# Patient Record
Sex: Male | Born: 1990 | Race: Black or African American | Hispanic: No | Marital: Single | Smoking: Current every day smoker
Health system: Southern US, Community
[De-identification: ages and names within clinical notes are randomized; demographics above are authoritative.]

---

## 2009-08-09 ENCOUNTER — Emergency Department: Payer: Self-pay | Admitting: Unknown Physician Specialty

## 2011-09-12 ENCOUNTER — Emergency Department: Payer: Self-pay | Admitting: Emergency Medicine

## 2011-09-19 ENCOUNTER — Emergency Department: Payer: Self-pay | Admitting: *Deleted

## 2012-06-08 ENCOUNTER — Emergency Department: Payer: Self-pay | Admitting: Emergency Medicine

## 2012-06-08 LAB — URINALYSIS, COMPLETE
Bilirubin,UR: NEGATIVE
Blood: NEGATIVE
Ketone: NEGATIVE
Nitrite: NEGATIVE
Ph: 7 (ref 4.5–8.0)
Protein: NEGATIVE
RBC,UR: 1 /HPF (ref 0–5)
Specific Gravity: 1.009 (ref 1.003–1.030)

## 2012-06-08 LAB — COMPREHENSIVE METABOLIC PANEL
Alkaline Phosphatase: 70 U/L (ref 50–136)
BUN: 12 mg/dL (ref 7–18)
Calcium, Total: 9.3 mg/dL (ref 8.5–10.1)
Creatinine: 0.94 mg/dL (ref 0.60–1.30)
EGFR (African American): >60
EGFR (Non-African Amer.): >60
Glucose: 80 mg/dL (ref 65–99)
SGOT(AST): 41 U/L — ABNORMAL HIGH (ref 15–37)
SGPT (ALT): 27 U/L (ref 12–78)
Sodium: 140 mmol/L (ref 136–145)

## 2012-06-08 LAB — LIPASE, BLOOD: Lipase: 57 U/L — ABNORMAL LOW (ref 73–393)

## 2012-06-08 LAB — CBC: HCT: 46.1 % (ref 40.0–52.0)

## 2014-02-27 ENCOUNTER — Emergency Department: Payer: Self-pay | Admitting: Emergency Medicine

## 2014-08-14 ENCOUNTER — Emergency Department
Admission: EM | Admit: 2014-08-14 | Discharge: 2014-08-14 | Disposition: A | Discharge status: Home / Self Care | Payer: Self-pay | Attending: Emergency Medicine | Admitting: Emergency Medicine

## 2014-08-14 ENCOUNTER — Encounter: Payer: Self-pay | Admitting: Emergency Medicine

## 2014-08-14 ENCOUNTER — Emergency Department: Payer: Self-pay

## 2014-08-14 DIAGNOSIS — Y9389 Activity, other specified: Secondary | ICD-10-CM | POA: Insufficient documentation

## 2014-08-14 DIAGNOSIS — Y998 Other external cause status: Secondary | ICD-10-CM | POA: Insufficient documentation

## 2014-08-14 DIAGNOSIS — S0181XA Laceration without foreign body of other part of head, initial encounter: Secondary | ICD-10-CM | POA: Insufficient documentation

## 2014-08-14 DIAGNOSIS — S0083XA Contusion of other part of head, initial encounter: Secondary | ICD-10-CM | POA: Insufficient documentation

## 2014-08-14 DIAGNOSIS — S0181XD Laceration without foreign body of other part of head, subsequent encounter: Secondary | ICD-10-CM

## 2014-08-14 DIAGNOSIS — Y9289 Other specified places as the place of occurrence of the external cause: Secondary | ICD-10-CM | POA: Insufficient documentation

## 2014-08-14 DIAGNOSIS — X58XXXA Exposure to other specified factors, initial encounter: Secondary | ICD-10-CM | POA: Insufficient documentation

## 2014-08-14 DIAGNOSIS — Y929 Unspecified place or not applicable: Secondary | ICD-10-CM | POA: Insufficient documentation

## 2014-08-14 MED ORDER — OXYCODONE-ACETAMINOPHEN 5-325 MG PO TABS
1.0000 | ORAL_TABLET | Freq: Once | ORAL | Status: AC
  Administered 2014-08-14: 1 via ORAL

## 2014-08-14 MED ORDER — SILVER NITRATE-POT NITRATE 75-25 % EX MISC
CUTANEOUS | Status: AC
  Administered 2014-08-14: 15:00:00
  Filled 2014-08-14: qty 2

## 2014-08-14 MED ORDER — OXYCODONE-ACETAMINOPHEN 5-325 MG PO TABS
ORAL_TABLET | ORAL | Status: AC
  Administered 2014-08-14: 1 via ORAL
  Filled 2014-08-14: qty 1

## 2014-08-14 MED ORDER — TRAMADOL HCL 50 MG PO TABS: 50.0000 mg | ORAL_TABLET | Freq: Four times a day (QID) | ORAL | Status: AC | PRN

## 2014-08-14 NOTE — ED Notes (Signed)
Pt discharged home after verbalizing understanding of discharge instructions; nad noted. 

## 2014-08-14 NOTE — ED Notes (Signed)
Patient presents to the ED with bleeding and swelling around wound where stitches were placed a couple of hours ago.  Patient is in no obvious distress.  Bleeding is slight and controlled from the top of laceration that is on the patient's forehead.

## 2014-08-14 NOTE — Discharge Instructions (Signed)
Facial Laceration A facial laceration is a cut on the face. These injuries can be painful and cause bleeding. Some cuts may need to be closed with stitches (sutures), skin adhesive strips, or wound glue. Cuts usually heal quickly but can leave a scar. It can take 1-2 years for the scar to go away completely. HOME CARE   Only take medicines as told by your doctor.  Follow your doctor's instructions for wound care. For Stitches:  Keep the cut clean and dry.  If you have a bandage (dressing), change it at least once a day. Change the bandage if it gets wet or dirty, or as told by your doctor.  Wash the cut with soap and water 2 times a day. Rinse the cut with water. Pat it dry with a clean towel.  Put a thin layer of medicated cream on the cut as told by your doctor.  You may shower after the first 24 hours. Do not soak the cut in water until the stitches are removed.  Have your stitches removed as told by your doctor.  Do not wear any makeup until a few days after your stitches are removed. For Skin Adhesive Strips:  Keep the cut clean and dry.  Do not get the strips wet. You may take a bath, but be careful to keep the cut dry.  If the cut gets wet, pat it dry with a clean towel.  The strips will fall off on their own. Do not remove the strips that are still stuck to the cut. For Wound Glue:  You may shower or take baths. Do not soak or scrub the cut. Do not swim. Avoid heavy sweating until the glue falls off on its own. After a shower or bath, pat the cut dry with a clean towel.  Do not put medicine or makeup on your cut until the glue falls off.  If you have a bandage, do not put tape over the glue.  Avoid lots of sunlight or tanning lamps until the glue falls off.  The glue will fall off on its own in 5-10 days. Do not pick at the glue. After Healing: Put sunscreen on the cut for the first year to reduce your scar. GET HELP RIGHT AWAY IF:   Your cut area gets red,  painful, or puffy (swollen).  You see a yellowish-white fluid (pus) coming from the cut.  You have chills or a fever. MAKE SURE YOU:   Understand these instructions.  Will watch your condition.  Will get help right away if you are not doing well or get worse. Document Released: 07/18/2007 Document Revised: 11/19/2012 Document Reviewed: 09/11/2012 [facility] Patient Information 2015 [place], [place]. This information is not intended to replace advice given to you by your health care provider. Make sure you discuss any questions you have with your health care provider.  Hematoma A hematoma is a collection of blood. The collection of blood can turn into a hard, painful lump under the skin. Your skin may turn blue or yellow if the hematoma is close to the surface of the skin. Most hematomas get better in a few days to weeks. Some hematomas are serious and need medical care. Hematomas can be very small or very big. HOME CARE  Apply ice to the injured area:  Put ice in a plastic bag.  Place a towel between your skin and the bag.  Leave the ice on for 20 minutes, 2-3 times a day for the first 1 to  2 days.  After the first 2 days, switch to using warm packs on the injured area.  Raise (elevate) the injured area to lessen pain and puffiness (swelling). You may also wrap the area with an elastic bandage. Make sure the bandage is not wrapped too tight.  If you have a painful hematoma on your leg or foot, you may use crutches for a couple days.  Only take medicines as told by your doctor. GET HELP RIGHT AWAY IF:   Your pain gets worse.  Your pain is not controlled with medicine.  You have a fever.  Your puffiness gets worse.  Your skin turns more blue or yellow.  Your skin over the hematoma breaks or starts bleeding.  Your hematoma is in your chest or belly (abdomen) and you are short of breath, feel weak, or have a change in consciousness.  Your hematoma is on your scalp and you  have a headache that gets worse or a change in alertness or consciousness. MAKE SURE YOU:   Understand these instructions.  Will watch your condition.  Will get help right away if you are not doing well or get worse. Document Released: 03/08/2004 Document Revised: 10/01/2012 Document Reviewed: 07/09/2012 [facility] Patient Information 2015 [place]. This information is not intended to replace advice given to you by your health care provider. Make sure you discuss any questions you have with your health care provider.  Keep the wound clean. Apply ice to reduce swelling and bleeding associated with the blood collection under the skin.  Continue on the previously prescribed pain medicine, you may increase the dose to 2 tabs at a time for pain.  Follow-up with your provider, or return to the ED as instructed for suture removal.
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
## 2014-08-21 ENCOUNTER — Encounter: Payer: Self-pay | Admitting: Emergency Medicine

## 2014-08-21 ENCOUNTER — Emergency Department
Admission: EM | Admit: 2014-08-21 | Discharge: 2014-08-21 | Disposition: A | Discharge status: Home / Self Care | Payer: Self-pay | Attending: Emergency Medicine | Admitting: Emergency Medicine

## 2014-08-21 DIAGNOSIS — T8131XA Disruption of external operation (surgical) wound, not elsewhere classified, initial encounter: Secondary | ICD-10-CM | POA: Insufficient documentation

## 2014-08-21 DIAGNOSIS — Z720 Tobacco use: Secondary | ICD-10-CM | POA: Insufficient documentation

## 2014-08-21 DIAGNOSIS — Y838 Other surgical procedures as the cause of abnormal reaction of the patient, or of later complication, without mention of misadventure at the time of the procedure: Secondary | ICD-10-CM | POA: Insufficient documentation

## 2014-08-21 DIAGNOSIS — Z4802 Encounter for removal of sutures: Secondary | ICD-10-CM | POA: Insufficient documentation

## 2014-08-21 MED ORDER — TRAMADOL HCL 50 MG PO TABS: 50.0000 mg | ORAL_TABLET | Freq: Four times a day (QID) | ORAL | Status: AC | PRN

## 2014-08-21 MED ORDER — TRAMADOL HCL 50 MG PO TABS
50.0000 mg | ORAL_TABLET | Freq: Once | ORAL | Status: AC
  Administered 2014-08-21: 50 mg via ORAL

## 2014-08-21 MED ORDER — SULFAMETHOXAZOLE-TRIMETHOPRIM 800-160 MG PO TABS
1.0000 | ORAL_TABLET | Freq: Once | ORAL | Status: AC
  Administered 2014-08-21: 1 via ORAL

## 2014-08-21 MED ORDER — TRAMADOL HCL 50 MG PO TABS
ORAL_TABLET | ORAL | Status: AC
  Administered 2014-08-21: 50 mg via ORAL
  Filled 2014-08-21: qty 2

## 2014-08-21 MED ORDER — SULFAMETHOXAZOLE-TRIMETHOPRIM 800-160 MG PO TABS: 1.0000 | ORAL_TABLET | Freq: Two times a day (BID) | ORAL | Status: AC

## 2014-08-21 MED ORDER — SULFAMETHOXAZOLE-TRIMETHOPRIM 800-160 MG PO TABS
ORAL_TABLET | ORAL | Status: AC
  Administered 2014-08-21: 1 via ORAL
  Filled 2014-08-21: qty 1

## 2014-08-21 NOTE — Discharge Instructions (Signed)

## 2014-08-21 NOTE — ED Notes (Signed)
R brow and forehead

## 2014-08-21 NOTE — ED Notes (Signed)
D/c instructions reviewed w/ pt - pt denies any further questions or concerns at present.  Pt instructed to not use alcohol, drive, or operate heavy machinery while take the prescription pain medications as they could make him drowsy - pt verbalized understanding.   
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
## 2014-08-23 ENCOUNTER — Encounter: Payer: Self-pay | Admitting: Emergency Medicine

## 2014-08-23 ENCOUNTER — Emergency Department
Admission: EM | Admit: 2014-08-23 | Discharge: 2014-08-23 | Disposition: A | Discharge status: Home / Self Care | Payer: Self-pay | Attending: Emergency Medicine | Admitting: Emergency Medicine

## 2014-08-23 DIAGNOSIS — Z4800 Encounter for change or removal of nonsurgical wound dressing: Secondary | ICD-10-CM | POA: Insufficient documentation

## 2014-08-23 DIAGNOSIS — Z792 Long term (current) use of antibiotics: Secondary | ICD-10-CM | POA: Insufficient documentation

## 2014-08-23 DIAGNOSIS — Z5189 Encounter for other specified aftercare: Secondary | ICD-10-CM

## 2014-08-23 DIAGNOSIS — Z720 Tobacco use: Secondary | ICD-10-CM | POA: Insufficient documentation

## 2014-08-23 NOTE — ED Provider Notes (Signed)
[facility] Emergency Department Provider Note  ____________________________________________  Time seen:  11:51 AM  I have reviewed the triage vital signs and the nursing notes.   HISTORY  Chief Complaint Wound Check   HPI Todd Moreno is a 24 y.o. male patient is here for wound recheck. Patient states that he has Steri-Strips placed on his last visit and also culture done. They're here to have the area looked at and also to find out the results of his culture. He continues to takes Septra DS and tramadol. Wound is on the right upper forehead.Patient is afebrile   History reviewed. No pertinent past medical history.  There are no active problems to display for this patient.   History reviewed. No pertinent past surgical history.  Current Outpatient Rx  Name  Route  Sig  Dispense  Refill  . sulfamethoxazole-trimethoprim (BACTRIM DS,SEPTRA DS) 800-160 MG per tablet   Oral   Take 1 tablet by mouth 2 (two) times daily.   20 tablet   0   . traMADol (ULTRAM) 50 MG tablet   Oral   Take 1 tablet (50 mg total) by mouth every 6 (six) hours as needed for moderate pain.   12 tablet   0   . traMADol (ULTRAM) 50 MG tablet   Oral   Take 1 tablet (50 mg total) by mouth every 6 (six) hours as needed for moderate pain.   12 tablet   0     Allergies Review of patient's allergies indicates no known allergies.  No family history on file.  Social History History  Substance Use Topics  . Smoking status: Current Every Day Smoker -- 0.10 packs/day    Types: Cigarettes  . Smokeless tobacco: Not on file  . Alcohol Use: No    Review of Systems Constitutional: No fever/chills  .  __________________________________________   PHYSICAL EXAM:  VITAL SIGNS: ED Triage Vitals  Enc Vitals Group     BP 08/23/14 1116 112/65 mmHg     Pulse Rate 08/23/14 1116 67     Resp 08/23/14 1116 18     Temp 08/23/14 1116 97.7 F (36.5 C)     Temp Source 08/23/14  1116 Oral     SpO2 08/23/14 1116 98 %     Weight 08/23/14 1116 160 lb (72.576 kg)     Height 08/23/14 1116  (1.702 m)     Head Cir --      Peak Flow --      Pain Score --      Pain Loc --      Pain Edu? --      Excl. in GC? --     Constitutional: Alert and oriented. Well appearing and in no acute distress. Eyes: Conjunctivae are normal. PERRL. EOMI. Head: Atraumatic. Nose: No congestion/rhinnorhea. .Neck: No stridor.  Skin:  Skin is warm, dry and intact. Steri-Strips are in place. There is no erythema around the Steri-Strip edges. No drainage was noted. Psychiatric: Mood and affect are normal. Speech and behavior are normal.  ____________________________________________   LABS (all labs ordered are listed, but only abnormal results are displayed)  Labs Reviewed - No data to display PROCEDURES  Procedure(s) performed: None  Critical Care performed: No  ____________________________________________   INITIAL IMPRESSION / ASSESSMENT AND PLAN / ED COURSE  Pertinent labs & imaging results that were available during my care of the patient were reviewed by me and considered in my medical decision making (see chart for  details).  Patient is to continue taking Septra DS until finished. He is to allow the Steri-Strips to come off on their own. Culture results was looked at with no growth in 48 hours. ____________________________________________   FINAL CLINICAL IMPRESSION(S) / ED DIAGNOSES  Final diagnoses:  Encounter for wound re-check      Tommi RumpsRhonda L Makeila Yamaguchi, PA-C 08/23/14 1154  Phineas SemenGraydon Goodman, MD 08/23/14 1252

## 2014-08-23 NOTE — ED Notes (Signed)
Here for wound check

## 2014-08-24 LAB — WOUND CULTURE: Special Requests: NORMAL

## 2014-08-26 ENCOUNTER — Emergency Department
Admission: EM | Admit: 2014-08-26 | Discharge: 2014-08-26 | Disposition: A | Discharge status: Home / Self Care | Payer: Self-pay | Attending: Emergency Medicine | Admitting: Emergency Medicine

## 2014-08-26 ENCOUNTER — Encounter: Payer: Self-pay | Admitting: *Deleted

## 2014-08-26 DIAGNOSIS — Y838 Other surgical procedures as the cause of abnormal reaction of the patient, or of later complication, without mention of misadventure at the time of the procedure: Secondary | ICD-10-CM | POA: Insufficient documentation

## 2014-08-26 DIAGNOSIS — Z720 Tobacco use: Secondary | ICD-10-CM | POA: Insufficient documentation

## 2014-08-26 DIAGNOSIS — T8131XD Disruption of external operation (surgical) wound, not elsewhere classified, subsequent encounter: Secondary | ICD-10-CM | POA: Insufficient documentation

## 2014-08-26 DIAGNOSIS — Z4801 Encounter for change or removal of surgical wound dressing: Secondary | ICD-10-CM | POA: Insufficient documentation

## 2014-08-26 DIAGNOSIS — Z5189 Encounter for other specified aftercare: Secondary | ICD-10-CM

## 2014-08-26 NOTE — Discharge Instructions (Signed)
Keep clean and dry.Clean daily with soap and water, rinse, pat dry then apply thin layer of topical antibiotic such as neosproin. Keep steri-strips in place.   Return to ER in 3-4 days for wound check. Return to ER sooner for new or worsening concerns.     Wound Dehiscence Wound dehiscence is when a surgical cut (incision) breaks open and does not heal properly after surgery. It usually happens 7-10 days after surgery. This can be a serious condition. It is important to identify and treat this condition early.  CAUSES  Some common causes of wound dehiscence include:  Stretching of the wound area. This may be caused by lifting, vomiting, violent coughing, or straining during bowel movements.  Wound infection.  Early stitch (suture) removal. RISK FACTORS Various things can increase your risk of developing wound dehiscence, including:  Obesity.  Lung disease.  Smoking.  Poor nutrition.  Contamination during surgery. SIGNS AND SYMPTOMS  Bleeding from the wound.  Pain.  Fever.  Wound starts breaking open. DIAGNOSIS  Your health care provider may diagnose wound dehiscence by monitoring the incision and noting any changes in the wound. These changes can include an increase in drainage or pain. The health care provider may also ask you if you have noticed any stretching or tearing of the wound.  Wound cultures may be taken to determine if there is an infection.  Imaging studies, such as an MRI scan or CT scan, may be done to determine if there is a collection of pus or fluid in the wound area. TREATMENT Treatment may include:  Wound care.  Surgical repair.  Antibiotic medicine to treat or prevent infection.  Medicines to reduce pain and swelling. HOME CARE INSTRUCTIONS   Only take over-the-counter or prescription medicines for pain, discomfort, or fever as directed by your health care provider. Taking pain medicine 30 minutes before changing a bandage (dressing) can  help relieve pain.  Take your antibiotics as directed. Finish them even if you start to feel better.  Gently wash the area with mild soap and water 2 times a day, or as directed. Rinse off the soap. Pat the area dry with a clean towel. Do not rub the wound. This may cause bleeding.  Follow your health care provider's instructions for how often you need to change the dressing and packing inside. Wash your hands well before and after changing your dressing. Apply a dressing to the wound as directed.  Take showers. Do not soak the wound, bathe, swim, or use a hot tub until directed by your health care provider.  Avoid exercises that make you sweat heavily.  Use anti-itch medicine as directed by your health care provider. The wound may itch when it is healing. Do not pick or scratch at the wound.  Do not lift more than 10 pounds (4.5 kg) until the wound is healed, or as directed by your health care provider.  Keep all follow-up appointments as directed. SEEK MEDICAL CARE IF:  You have excessive bleeding from your surgical wound.  Your wound does not seem to be healing properly.  You have a fever. SEEK IMMEDIATE MEDICAL CARE IF:   You have increased swelling or redness around the wound.  You have increasing pain in the wound.  You have an increasing amount of pus coming from the wound.  Your wound breaks open farther. MAKE SURE YOU:   Understand these instructions.  Will watch your condition.  Will get help right away if you are not doing  well or get worse. Document Released: 04/21/2003 Document Revised: 02/03/2013 Document Reviewed: 10/06/2012 [facility] Patient Information 2015 [place], [place]. This information is not intended to replace advice given to you by your health care provider. Make sure you discuss any questions you have with your health care provider.  Wound Check Your wound appears healthy today. Your wound will heal gradually over time. Eventually a scar will form  that will fade with time. FACTORS THAT AFFECT SCAR FORMATION:  People differ in the severity in which they scar.  Scar severity varies according to location, size, and the traits you inherited from your parents (genetic predisposition).  Irritation to the wound from infection, rubbing, or chemical exposure will increase the amount of scar formation. HOME CARE INSTRUCTIONS   If you were given a dressing, you should change it at least once a day or as instructed by your caregiver. If the bandage sticks, soak it off with a solution of hydrogen peroxide.  If the bandage becomes wet, dirty, or develops a bad smell, change it as soon as possible.  Look for signs of infection.  Only take over-the-counter or prescription medicines for pain, discomfort, or fever as directed by your caregiver. SEEK IMMEDIATE MEDICAL CARE IF:   You have redness, swelling, or increasing pain in the wound.  You notice pus coming from the wound.  You have a fever.  You notice a bad smell coming from the wound or dressing. Document Released: 11/05/2003 Document Revised: 04/23/2011 Document Reviewed: 01/29/2005 [facility] Patient Information 2015 [place], [place]. This information is not intended to replace advice given to you by your health care provider. Make sure you discuss any questions you have with your health care provider.

## 2014-08-26 NOTE — ED Notes (Signed)
Pt had lac to his forehead 1.5 weeks ago, lac was sutured, sutures removed on Saturday. Pt would like his wound cleaned and rechecked.
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
## 2020-11-10 ENCOUNTER — Other Ambulatory Visit: Payer: Self-pay

## 2020-11-10 MED ORDER — VENLAFAXINE HCL ER 75 MG PO CP24
75.0000 mg | ORAL_CAPSULE | Freq: Every day | ORAL | 1 refills | Status: AC
  Filled 2020-11-10: qty 30, 30d supply, fill #0

## 2021-03-17 ENCOUNTER — Telehealth: Payer: Self-pay | Admitting: Pharmacist

## 2021-03-17 NOTE — Telephone Encounter (Signed)
Patient failed to provide requested 2023 financial documentation. No additional medication assistance will be provided by [facility] without the required proof of income documentation. Patient notified by letter. ? ?Todd Moreno ?[facility] ?

## 2021-03-22 ENCOUNTER — Other Ambulatory Visit: Payer: Self-pay

## 2021-03-31 ENCOUNTER — Telehealth: Payer: Self-pay | Admitting: Pharmacy Technician

## 2021-03-31 NOTE — Telephone Encounter (Signed)
Patient failed to provide 2023 proof of income.  No additional medication assistance will be provided by [facility] without the required proof of income documentation.  Patient notified by letter.  Sherilyn Dacosta Care Manager [facility]    [address] [place], [place]  [address]  March 17, 2021    Dear Harriett Sine:  This is to inform you that you are no longer eligible to receive medication assistance at [facility].  The reason(s) are:    _____Your total gross monthly household income exceeds 300% of the Federal Poverty Level.   _____Tangible assets (savings, checking, stocks/bonds, pension, retirement, etc.) exceeds our limit  _____You are eligible to receive benefits from [facility], [facility] or HIV Medication              Assistance Program _____You are eligible to receive benefits from a Medicare Part D plan _____You have prescription insurance  _____You are not an [facility] resident __X__Failure to provide all requested documentation (proof of income for 2023, and/or Patient Intake Application, DOH Attestation, Contract, etc).    Medication assistance will resume once all requested documentation has been returned to our clinic.  If you have questions, please contact our clinic at [phone].    Thank you,  [facility]

## 2021-05-14 ENCOUNTER — Emergency Department: Payer: Self-pay

## 2021-05-14 ENCOUNTER — Encounter: Payer: Self-pay | Admitting: Intensive Care

## 2021-05-14 ENCOUNTER — Emergency Department
Admission: EM | Admit: 2021-05-14 | Discharge: 2021-05-14 | Disposition: A | Discharge status: Home / Self Care | Payer: Self-pay | Attending: Emergency Medicine | Admitting: Emergency Medicine

## 2021-05-14 ENCOUNTER — Other Ambulatory Visit: Payer: Self-pay

## 2021-05-14 DIAGNOSIS — S92334A Nondisplaced fracture of third metatarsal bone, right foot, initial encounter for closed fracture: Secondary | ICD-10-CM | POA: Insufficient documentation

## 2021-05-14 DIAGNOSIS — S92211A Displaced fracture of cuboid bone of right foot, initial encounter for closed fracture: Secondary | ICD-10-CM | POA: Insufficient documentation

## 2021-05-14 DIAGNOSIS — S92355A Nondisplaced fracture of fifth metatarsal bone, left foot, initial encounter for closed fracture: Secondary | ICD-10-CM | POA: Insufficient documentation

## 2021-05-14 DIAGNOSIS — Y92410 Unspecified street and highway as the place of occurrence of the external cause: Secondary | ICD-10-CM | POA: Insufficient documentation

## 2021-05-14 MED ORDER — HYDROCODONE-ACETAMINOPHEN 5-325 MG PO TABS: 1.0000 | ORAL_TABLET | Freq: Four times a day (QID) | ORAL | 0 refills | Status: AC | PRN

## 2021-05-14 MED ORDER — HYDROCODONE-ACETAMINOPHEN 5-325 MG PO TABS
1.0000 | ORAL_TABLET | Freq: Once | ORAL | Status: AC
  Administered 2021-05-14: 1 via ORAL
  Filled 2021-05-14: qty 1
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
## 2021-05-14 NOTE — ED Triage Notes (Signed)
Patient presents with swollen and bruised right foot/ankle.  ?
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
